# Patient Record
Sex: Male | Born: 1970 | Race: White | Hispanic: No | Marital: Married | Smoking: Current some day smoker
Health system: Southern US, Community
[De-identification: ages and names within clinical notes are randomized; demographics above are authoritative.]

## PROBLEM LIST (undated history)

## (undated) DIAGNOSIS — K219 Gastro-esophageal reflux disease without esophagitis: Secondary | ICD-10-CM

## (undated) HISTORY — DX: Gastro-esophageal reflux disease without esophagitis: K21.9

---

## 2008-05-22 ENCOUNTER — Emergency Department (HOSPITAL_BASED_OUTPATIENT_CLINIC_OR_DEPARTMENT_OTHER): Admission: EM | Admit: 2008-05-22 | Discharge: 2008-05-22 | Payer: Self-pay | Admitting: Emergency Medicine

## 2009-02-07 ENCOUNTER — Ambulatory Visit: Payer: Self-pay | Admitting: Diagnostic Radiology

## 2009-02-07 ENCOUNTER — Emergency Department (HOSPITAL_BASED_OUTPATIENT_CLINIC_OR_DEPARTMENT_OTHER): Admission: EM | Admit: 2009-02-07 | Discharge: 2009-02-07 | Payer: Self-pay | Admitting: Emergency Medicine

## 2011-01-04 LAB — BASIC METABOLIC PANEL
BUN: 17 mg/dL (ref 6–23)
CO2: 27 mEq/L (ref 19–32)
Chloride: 107 mEq/L (ref 96–112)
Glucose, Bld: 123 mg/dL — ABNORMAL HIGH (ref 70–99)
Potassium: 3.8 mEq/L (ref 3.5–5.1)
Sodium: 143 mEq/L (ref 135–145)

## 2011-01-04 LAB — POCT CARDIAC MARKERS
CKMB, poc: <1 ng/mL — ABNORMAL LOW (ref 1.0–8.0)
CKMB, poc: <1 ng/mL — ABNORMAL LOW (ref 1.0–8.0)
Myoglobin, poc: 26.9 ng/mL (ref 12–200)
Myoglobin, poc: 44.9 ng/mL (ref 12–200)
Troponin i, poc: <0.05 ng/mL (ref 0.00–0.09)

## 2011-01-04 LAB — CBC
HCT: 45.6 % (ref 39.0–52.0)
Hemoglobin: 15.1 g/dL (ref 13.0–17.0)
MCHC: 33 g/dL (ref 30.0–36.0)
MCV: 85.9 fL (ref 78.0–100.0)
Platelets: 248 10*3/uL (ref 150–400)
RDW: 12.7 % (ref 11.5–15.5)

## 2011-01-04 LAB — D-DIMER, QUANTITATIVE: D-Dimer, Quant: <0.22 ug/mL-FEU (ref 0.00–0.48)

## 2011-01-04 LAB — DIFFERENTIAL
Basophils Absolute: 0.1 10*3/uL (ref 0.0–0.1)
Basophils Relative: 1 % (ref 0–1)
Eosinophils Absolute: 0.3 10*3/uL (ref 0.0–0.7)
Eosinophils Relative: 2 % (ref 0–5)
Lymphocytes Relative: 16 % (ref 12–46)
Monocytes Absolute: 0.5 10*3/uL (ref 0.1–1.0)

## 2013-02-03 ENCOUNTER — Emergency Department (HOSPITAL_BASED_OUTPATIENT_CLINIC_OR_DEPARTMENT_OTHER)
Admission: EM | Admit: 2013-02-03 | Discharge: 2013-02-03 | Disposition: A | Discharge status: Home / Self Care | Payer: BC Managed Care – PPO | Attending: Emergency Medicine | Admitting: Emergency Medicine

## 2013-02-03 ENCOUNTER — Emergency Department (HOSPITAL_BASED_OUTPATIENT_CLINIC_OR_DEPARTMENT_OTHER): Payer: BC Managed Care – PPO

## 2013-02-03 ENCOUNTER — Encounter (HOSPITAL_BASED_OUTPATIENT_CLINIC_OR_DEPARTMENT_OTHER): Payer: Self-pay | Admitting: *Deleted

## 2013-02-03 DIAGNOSIS — Y9366 Activity, soccer: Secondary | ICD-10-CM | POA: Insufficient documentation

## 2013-02-03 DIAGNOSIS — Y92838 Other recreation area as the place of occurrence of the external cause: Secondary | ICD-10-CM | POA: Insufficient documentation

## 2013-02-03 DIAGNOSIS — Y9239 Other specified sports and athletic area as the place of occurrence of the external cause: Secondary | ICD-10-CM | POA: Insufficient documentation

## 2013-02-03 DIAGNOSIS — S93409A Sprain of unspecified ligament of unspecified ankle, initial encounter: Secondary | ICD-10-CM | POA: Insufficient documentation

## 2013-02-03 DIAGNOSIS — S93402A Sprain of unspecified ligament of left ankle, initial encounter: Secondary | ICD-10-CM

## 2013-02-03 DIAGNOSIS — W219XXA Striking against or struck by unspecified sports equipment, initial encounter: Secondary | ICD-10-CM | POA: Insufficient documentation

## 2013-02-03 MED ORDER — NAPROXEN 250 MG PO TABS
500.0000 mg | ORAL_TABLET | Freq: Once | ORAL | Status: AC
  Administered 2013-02-03: 500 mg via ORAL
  Filled 2013-02-03: qty 2

## 2013-02-03 NOTE — ED Provider Notes (Signed)
History     CSN: [id number]  Arrival date & time Mar 02, 2013  1950   First MD Initiated Contact with Patient 2013/03/02 Mar 02, 2300      Chief Complaint  Patient presents with  . Ankle Injury    (Consider location/radiation/quality/duration/timing/severity/associated sxs/prior treatment) HPI Is a 42 year old male who was playing soccer about 7:30. He suffered an inversion injury of the left ankle. He now has mild to moderate pain in the left lateral ankle. There is associated ecchymosis and swelling. He denies other injury. He denies motor or sensory deficit distally. He denies other injury. Pain is worse with palpation, movement or attempted ambulation.  History reviewed. No pertinent past medical history.  History reviewed. No pertinent past surgical history.  History reviewed. No pertinent family history.  History  Substance Use Topics  . Smoking status: Never Smoker   . Smokeless tobacco: Not on file  . Alcohol Use: No      Review of Systems  All other systems reviewed and are negative.    Allergies  Review of patient's allergies indicates no known allergies.  Home Medications  No current outpatient prescriptions on file.  BP 109/82  Pulse 98  Temp(Src) 99.7 F (37.6 C) (Oral)  Resp 16  Ht 5\' 9"  (1.753 m)  Wt 160 lb (72.576 kg)  BMI 23.62 kg/m2  SpO2 95%  Physical Exam General: Well-developed, well-nourished male in no acute distress; appearance consistent with age of record HENT: normocephalic, atraumatic Eyes: Normal appearance Neck: supple Heart: regular rate and rhythm Lungs: Normal respiratory effort and excursion Abdomen: soft; nondistended Extremities: Swelling, ecchymosis and tenderness over left lateral malleolus and left distal lower leg; left foot pulses normal with intact sensation and tendon function and brisk capillary refill distally Neurologic: Awake, alert and oriented; motor function intact in all extremities and symmetric; no facial  droop Skin: Warm and dry Psychiatric: Normal mood and affect    ED Course  Procedures (including critical care time)    MDM  Nursing notes and vitals signs, including pulse oximetry, reviewed.  Summary of this visit's results, reviewed by myself:   Imaging Studies: Dg Ankle Complete Left  03/02/2013  *RADIOLOGY REPORT*  Clinical Data: Twisted ankle.  Left ankle pain.  LEFT ANKLE COMPLETE - 3+ VIEW  Comparison: None.  Findings: No fracture.  Ankle mortise congruent.  Marked lateral malleolar soft tissue swelling is present.  Anatomic alignment.  IMPRESSION: No acute osseous abnormality.  Lateral soft tissue swelling.   Original Report Authenticated By: Andreas Newport, M.D.             Hanley Seamen, MD Mar 02, 2013 03/03/07

## 2013-02-03 NOTE — ED Notes (Signed)
Pt injured his left ankle playing soccer this p.m. Ice applied at triage.

## 2013-02-14 ENCOUNTER — Ambulatory Visit (INDEPENDENT_AMBULATORY_CARE_PROVIDER_SITE_OTHER): Payer: BC Managed Care – PPO | Admitting: Family Medicine

## 2013-02-14 ENCOUNTER — Encounter: Payer: Self-pay | Admitting: Family Medicine

## 2013-02-14 VITALS — BP 114/75 | HR 89 | Ht 68.0 in | Wt 160.0 lb

## 2013-02-14 DIAGNOSIS — S99922A Unspecified injury of left foot, initial encounter: Secondary | ICD-10-CM | POA: Insufficient documentation

## 2013-02-14 DIAGNOSIS — S99912A Unspecified injury of left ankle, initial encounter: Secondary | ICD-10-CM | POA: Insufficient documentation

## 2013-02-14 DIAGNOSIS — S8990XA Unspecified injury of unspecified lower leg, initial encounter: Secondary | ICD-10-CM

## 2013-02-14 NOTE — Patient Instructions (Addendum)
We will go ahead with an MRI of your left ankle/foot. It's very unusual that you still can't put weight on this and that you have tenderness over the navicular bone of the foot. This bone is notorious for not showing a fracture on x-rays. Continue using the ankle brace and crutches for now. Ice 15 minutes at a time 3-4 times a day. Elevate as much as possible. Ibuprofen 600mg  three times a day with food for pain and inflammation. I will call you the business day following your MRI to go over results and next steps.

## 2013-02-14 NOTE — Progress Notes (Addendum)
Subjective:    Patient ID: Martin Chavez, male    DOB: 1971-05-16, 42 y.o.   MRN: [id number]  PCP: None  HPI 42 yo M here for left ankle injury.  Patient reports on 5/11 he was playing soccer in the evening when he inverted his left ankle in an uneven part of the ground. Unable to bear weight following this and still unable to do so. Heard a loud pop. Was very swollen but now has improved. Has been icing. Not taking anything for pain. Using ankle brace and crutches. No prior injuries to left foot/ankle. Radiographs of left ankle negative.  History reviewed. No pertinent past medical history.  No current outpatient prescriptions on file prior to visit.   No current facility-administered medications on file prior to visit.    History reviewed. No pertinent past surgical history.  No Known Allergies  History   Social History  . Marital Status: Single    Spouse Name: N/A    Number of Children: N/A  . Years of Education: N/A   Occupational History  . Not on file.   Social History Main Topics  . Smoking status: Never Smoker   . Smokeless tobacco: Not on file  . Alcohol Use: No  . Drug Use: No  . Sexually Active: Not on file   Other Topics Concern  . Not on file   Social History Narrative  . No narrative on file    Family History  Problem Relation Age of Onset  . Sudden death Neg Hx   . Hypertension Neg Hx   . Hyperlipidemia Neg Hx   . Heart attack Neg Hx   . Diabetes Neg Hx     BP 114/75  Pulse 89  Ht 5\' 8"  (1.727 m)  Wt 160 lb (72.576 kg)  BMI 24.33 kg/m2  Review of Systems See HPI above.    Objective:   Physical Exam Gen: NAD  L ankle/foot: Mild-mod swelling medial to lateral ankle. Mod limitation of ROM all directions.  Able to resist all motions but pain with resisted IR at navicular area. TTP greatest at navicular, ATFL.  No TTP medial, lateral malleoli, base 5th, elsewhere foot/ankle, fibular head. Trace ant drawer.  + talar  tilt.   Negative syndesmotic compression. Thompsons test negative. NV intact distally.  MSK u/s:  Peroneal tendons and post tib tendon intact left ankle.  Increased fluid surrounding navicular and post tib tendon however.  No talar dome irregularities.    Assessment & Plan:  1. Left ankle/foot injury - initial radiographs negative of ankle.  Visualization of navicular does not show a fracture either.  Ultrasound not very sensitive for identifying fractures of this bone though post tib and peroneals appear intact.  Given he is 10 days out, with focal navicular tenderness, can still not bear weight and has very limited motion of ankle, advised we go ahead with MRI to assess for occult navicular or other fracture of ankle/foot.  Addendum 5/28:  MRI reviewed and discussed with patient.  No evidence of occult fracture.  Does have multiple contusions, not surprisingly has an ATFL tear.  Tendinopathy of post tib tendon.  ? Of longitudinal split type tear of peroneals - not seen on ultrasound and not very tender post to lateral malleolus - may be an artifact.  If present this is not a complete tear and he should do well with conservative care.  Start formal PT, icing, nsaids, compression.  Advised to f/u with me in 4 weeks  following start of PT for reevaluation.  Consider nitro patches in future.

## 2013-02-14 NOTE — Assessment & Plan Note (Signed)
initial radiographs negative of ankle.  Visualization of navicular does not show a fracture either.  Ultrasound not very sensitive for identifying fractures of this bone though post tib and peroneals appear intact.  Given he is 10 days out, with focal navicular tenderness, can still not bear weight and has very limited motion of ankle, advised we go ahead with MRI to assess for occult navicular or other fracture of ankle/foot.

## 2013-02-16 ENCOUNTER — Ambulatory Visit (HOSPITAL_BASED_OUTPATIENT_CLINIC_OR_DEPARTMENT_OTHER)
Admission: RE | Admit: 2013-02-16 | Discharge: 2013-02-16 | Disposition: A | Discharge status: Home / Self Care | Payer: BC Managed Care – PPO | Source: Ambulatory Visit | Attending: Family Medicine | Admitting: Family Medicine

## 2013-02-16 DIAGNOSIS — M25579 Pain in unspecified ankle and joints of unspecified foot: Secondary | ICD-10-CM | POA: Insufficient documentation

## 2013-02-16 DIAGNOSIS — S99912A Unspecified injury of left ankle, initial encounter: Secondary | ICD-10-CM

## 2013-02-16 DIAGNOSIS — X58XXXA Exposure to other specified factors, initial encounter: Secondary | ICD-10-CM | POA: Insufficient documentation

## 2013-02-16 DIAGNOSIS — M659 Synovitis and tenosynovitis, unspecified: Secondary | ICD-10-CM | POA: Insufficient documentation

## 2013-02-16 DIAGNOSIS — Y9366 Activity, soccer: Secondary | ICD-10-CM | POA: Insufficient documentation

## 2013-02-16 DIAGNOSIS — M65979 Unspecified synovitis and tenosynovitis, unspecified ankle and foot: Secondary | ICD-10-CM | POA: Insufficient documentation

## 2013-02-16 DIAGNOSIS — S9000XA Contusion of unspecified ankle, initial encounter: Secondary | ICD-10-CM | POA: Insufficient documentation

## 2013-02-16 DIAGNOSIS — M25473 Effusion, unspecified ankle: Secondary | ICD-10-CM | POA: Insufficient documentation

## 2013-02-16 DIAGNOSIS — M25476 Effusion, unspecified foot: Secondary | ICD-10-CM | POA: Insufficient documentation

## 2013-02-28 ENCOUNTER — Ambulatory Visit: Payer: BC Managed Care – PPO | Attending: Family Medicine | Admitting: Rehabilitation

## 2013-02-28 DIAGNOSIS — M25673 Stiffness of unspecified ankle, not elsewhere classified: Secondary | ICD-10-CM | POA: Insufficient documentation

## 2013-02-28 DIAGNOSIS — IMO0001 Reserved for inherently not codable concepts without codable children: Secondary | ICD-10-CM | POA: Insufficient documentation

## 2013-02-28 DIAGNOSIS — M25579 Pain in unspecified ankle and joints of unspecified foot: Secondary | ICD-10-CM | POA: Insufficient documentation

## 2013-02-28 DIAGNOSIS — R609 Edema, unspecified: Secondary | ICD-10-CM | POA: Insufficient documentation

## 2013-02-28 DIAGNOSIS — R262 Difficulty in walking, not elsewhere classified: Secondary | ICD-10-CM | POA: Insufficient documentation

## 2013-02-28 DIAGNOSIS — M25676 Stiffness of unspecified foot, not elsewhere classified: Secondary | ICD-10-CM | POA: Insufficient documentation

## 2013-03-04 ENCOUNTER — Ambulatory Visit: Payer: BC Managed Care – PPO | Admitting: Rehabilitation

## 2013-03-06 ENCOUNTER — Ambulatory Visit: Payer: BC Managed Care – PPO | Admitting: Rehabilitation

## 2013-07-15 ENCOUNTER — Emergency Department (HOSPITAL_BASED_OUTPATIENT_CLINIC_OR_DEPARTMENT_OTHER)
Admission: EM | Admit: 2013-07-15 | Discharge: 2013-07-15 | Disposition: A | Discharge status: Home / Self Care | Payer: BC Managed Care – PPO | Attending: Emergency Medicine | Admitting: Emergency Medicine

## 2013-07-15 DIAGNOSIS — M545 Low back pain, unspecified: Secondary | ICD-10-CM | POA: Insufficient documentation

## 2013-07-15 MED ORDER — DIAZEPAM 5 MG PO TABS: 5.0000 mg | ORAL_TABLET | Freq: Three times a day (TID) | ORAL | Status: DC | PRN

## 2013-07-15 MED ORDER — ONDANSETRON HCL 4 MG/2ML IJ SOLN
4.0000 mg | Freq: Once | INTRAMUSCULAR | Status: AC
  Administered 2013-07-15: 4 mg via INTRAVENOUS

## 2013-07-15 MED ORDER — FENTANYL CITRATE 0.05 MG/ML IJ SOLN
100.0000 ug | Freq: Once | INTRAMUSCULAR | Status: AC
  Administered 2013-07-15: 100 ug via INTRAVENOUS
  Filled 2013-07-15: qty 2

## 2013-07-15 MED ORDER — HYDROMORPHONE HCL 2 MG PO TABS: 2.0000 mg | ORAL_TABLET | ORAL | Status: DC | PRN

## 2013-07-15 MED ORDER — DIAZEPAM 5 MG/ML IJ SOLN
5.0000 mg | Freq: Once | INTRAMUSCULAR | Status: AC
  Administered 2013-07-15: 5 mg via INTRAVENOUS
  Filled 2013-07-15: qty 2

## 2013-07-15 MED ORDER — HYDROMORPHONE HCL PF 1 MG/ML IJ SOLN
1.0000 mg | Freq: Once | INTRAMUSCULAR | Status: AC
  Administered 2013-07-15: 1 mg via INTRAVENOUS

## 2013-07-15 MED ORDER — HYDROMORPHONE HCL PF 1 MG/ML IJ SOLN
INTRAMUSCULAR | Status: AC
  Administered 2013-07-15: 1 mg via INTRAVENOUS
  Filled 2013-07-15: qty 1

## 2013-07-15 MED ORDER — ONDANSETRON HCL 4 MG/2ML IJ SOLN
INTRAMUSCULAR | Status: AC
  Administered 2013-07-15: 4 mg via INTRAVENOUS
  Filled 2013-07-15: qty 2

## 2013-07-15 NOTE — ED Notes (Signed)
Pt reports that he qwas kicking a ball one day ago and began having slight back pain that progressed to unbearable pain today

## 2013-07-15 NOTE — ED Provider Notes (Signed)
CSN: [id number]     Arrival date & time 07/15/13  0510 History   First MD Initiated Contact with Patient 07/15/13 [phone]     Chief Complaint  Patient presents with  . Back Pain   (Consider location/radiation/quality/duration/timing/severity/associated sxs/prior Treatment) HPI This is a 42 year old male with a remote history of back pain. He was kicking a ball yesterday evening for recreation. He developed some mild lower back pain but worsened overnight and became severe this morning. The pain is in the lumbar region radiating to both buttocks, left greater than right. It is worse with movement. He describes it as feeling like spasms. He denies associated lower sternum and leg numbness or weakness, saddle anesthesia or bowel or bladder changes. He took naproxen at home without relief. He was given 50 mcg of fentanyl IV by EMS prior to arrival with partial relief of his pain. He placed a lumbar brace on himself earlier.   No past medical history on file. No past surgical history on file. Family History  Problem Relation Age of Onset  . Sudden death Neg Hx   . Hypertension Neg Hx   . Hyperlipidemia Neg Hx   . Heart attack Neg Hx   . Diabetes Neg Hx    History  Substance Use Topics  . Smoking status: Never Smoker   . Smokeless tobacco: Not on file  . Alcohol Use: No    Review of Systems  All other systems reviewed and are negative.    Allergies  Review of patient's allergies indicates no known allergies.  Home Medications  No current outpatient prescriptions on file. BP 123/88  Pulse 62  Temp(Src) 97.8 F (36.6 C) (Oral)  Ht 5\' 8"  (1.727 m)  Wt 160 lb (72.576 kg)  BMI 24.33 kg/m2  SpO2 100%  Physical Exam General: Well-developed, well-nourished male in no acute distress; appearance consistent with age of record; appears uncomfortable HENT: normocephalic; atraumatic Eyes: pupils equal, round and reactive to light; extraocular muscles intact Neck: supple Heart: regular  rate and rhythm Lungs: clear to auscultation bilaterally Abdomen: soft; nondistended; nontender; no masses or hepatosplenomegaly; bowel sounds present Extremities: No deformity; full range of motion; pulses normal Back: Bilateral paralumbar tenderness; pain on straight leg raise at 15 on the left, 30 on the right; pain on attempted movement of the lower back Neurologic: Awake, alert and oriented; motor function intact in all extremities and symmetric; sensation intact and symmetrical; no facial droop Skin: Warm and dry Psychiatric: Anxious    ED Course  Procedures (including critical care time)  MDM  6:20 AM Patient resting comfortably at this time after an additional 150 mcg of fentanyl, 5 mg Valium 1 mg of Dilaudid. Oxygen saturation on room air is in the high 90s.  6:43 AM Patient able to stand and ambulate without severe pain. We will treat him with analgesics and muscle accident. He was advised that most back pain resolves in a few days but in the meantime he should limit activity as tolerated. He states he has a primary care physician with whom he can followup should symptoms persist.    Hanley Seamen, MD 07/15/13 [phone]

## 2013-12-01 IMAGING — CR DG ANKLE COMPLETE 3+V*L*
3 series · 3 of 3 positions shown · non-contrast
Comparison: None.

CLINICAL DATA: Twisted ankle.  Left ankle pain.

LEFT ANKLE COMPLETE - 3+ VIEW

[t ankle joint ap left]
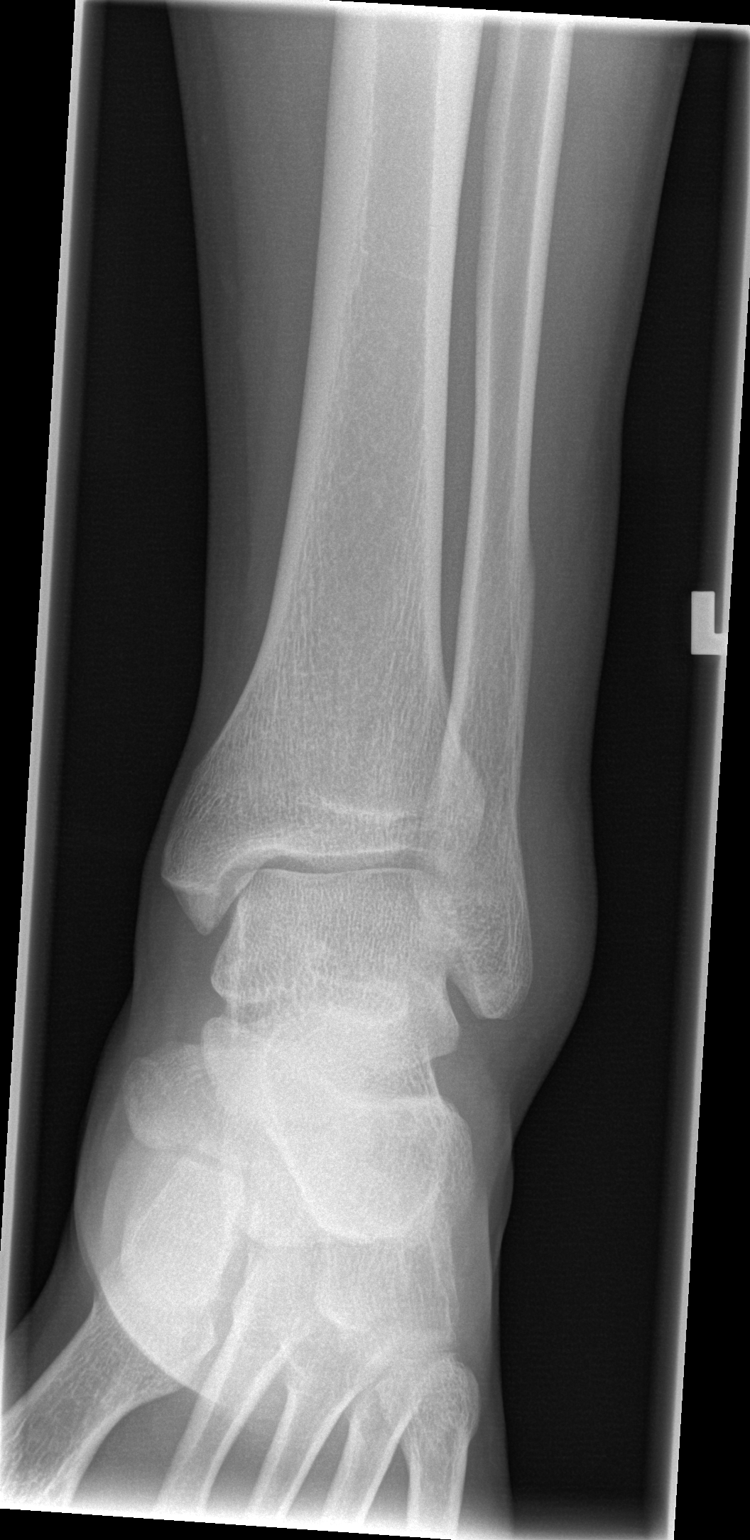

[t ankle joint oblique left]
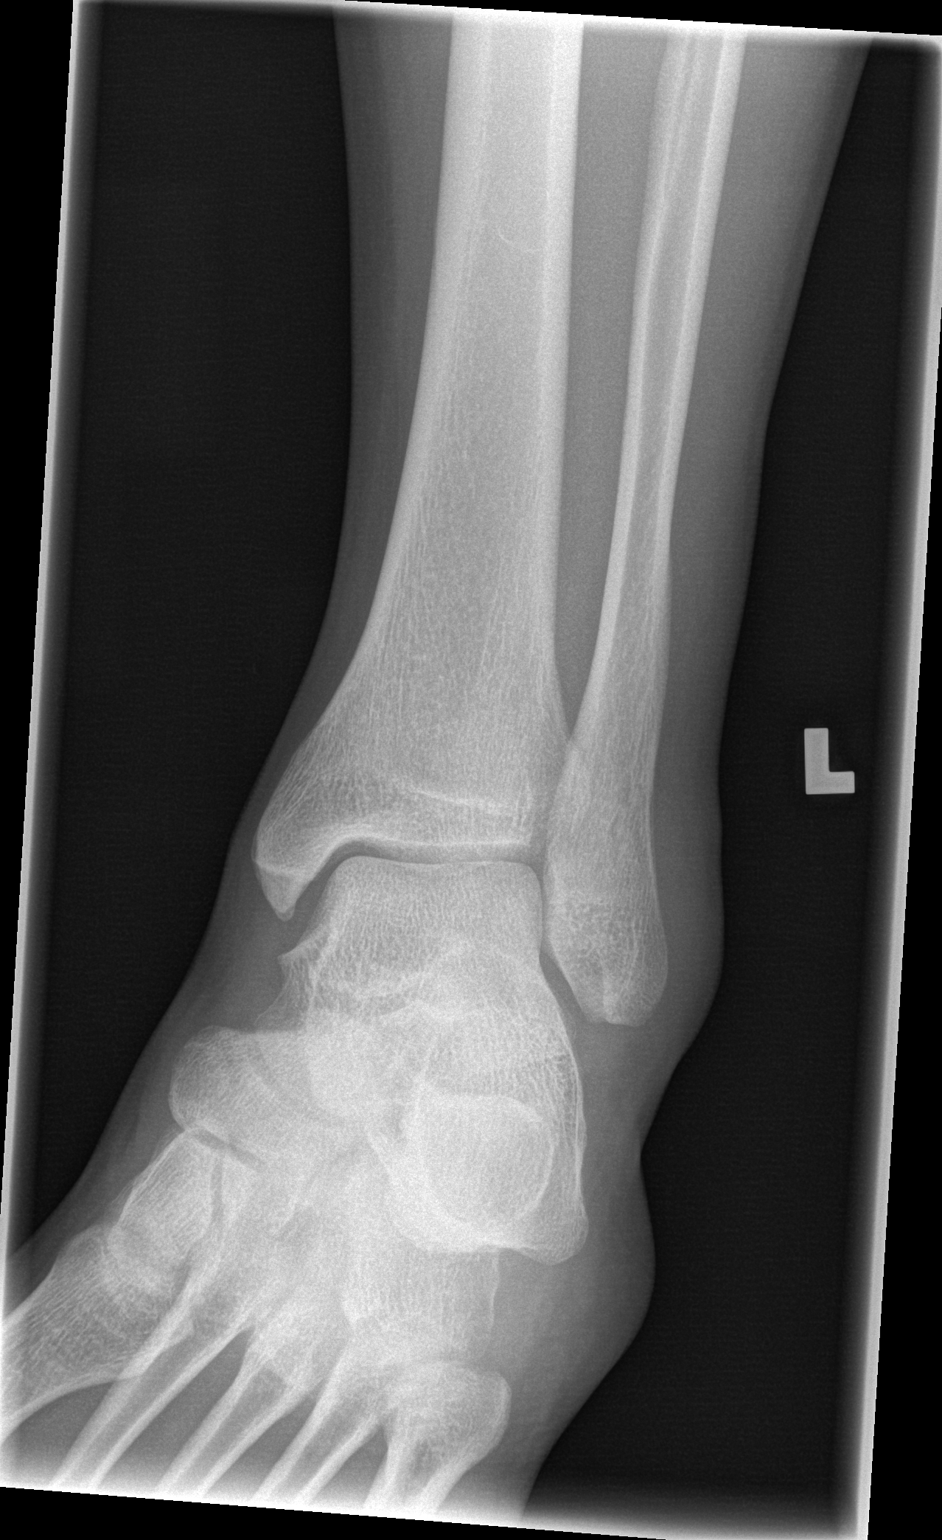

[t ankle joint lat left]
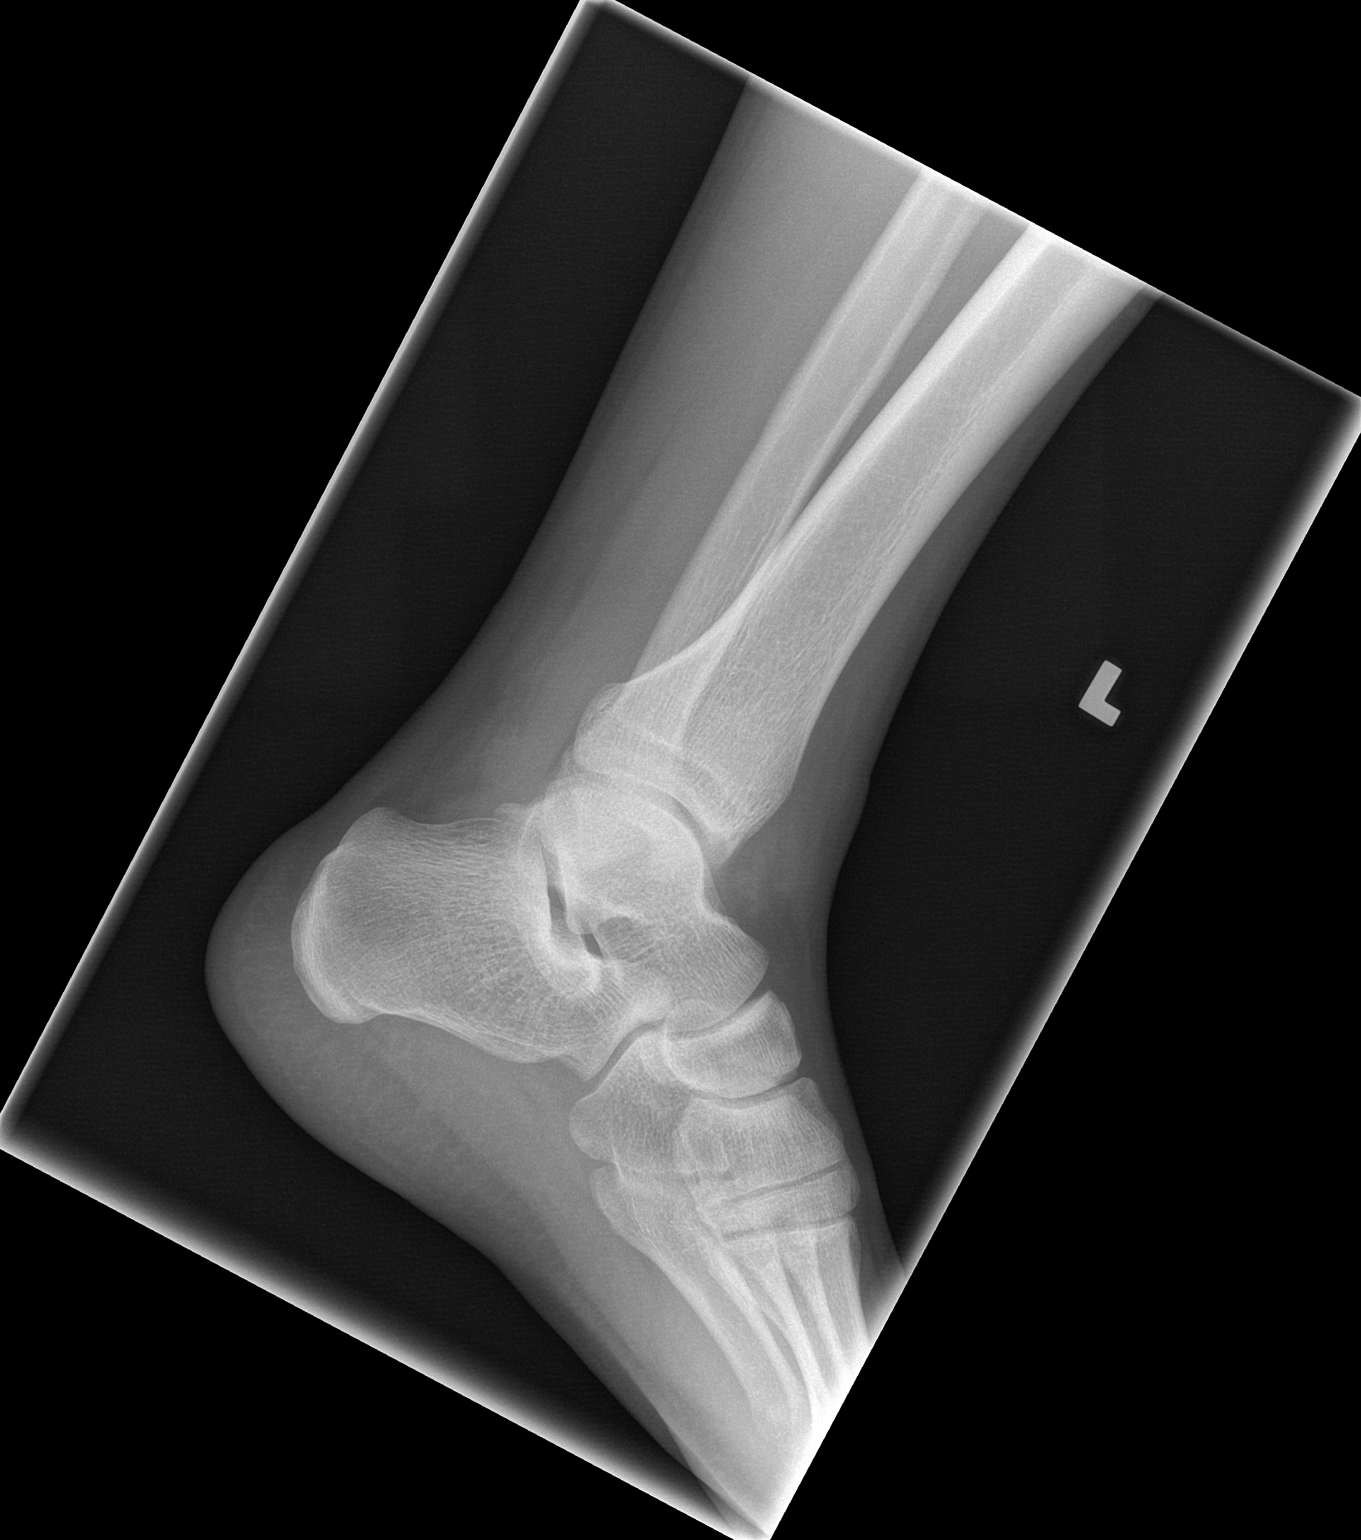

[3 of 3 positions shown; findings below may reference images not displayed]

FINDINGS: No fracture.  Ankle mortise congruent.  Marked lateral
malleolar soft tissue swelling is present.  Anatomic alignment.
IMPRESSION: No acute osseous abnormality.  Lateral soft tissue swelling.

## 2018-09-21 ENCOUNTER — Other Ambulatory Visit: Payer: Self-pay | Admitting: Gastroenterology

## 2018-09-21 DIAGNOSIS — K760 Fatty (change of) liver, not elsewhere classified: Secondary | ICD-10-CM

## 2021-09-21 DIAGNOSIS — D485 Neoplasm of uncertain behavior of skin: Secondary | ICD-10-CM | POA: Diagnosis not present

## 2021-09-21 DIAGNOSIS — D224 Melanocytic nevi of scalp and neck: Secondary | ICD-10-CM | POA: Diagnosis not present

## 2022-01-13 ENCOUNTER — Encounter: Payer: Self-pay | Admitting: Medical

## 2022-01-13 ENCOUNTER — Ambulatory Visit: Payer: BC Managed Care – PPO | Admitting: Medical

## 2022-01-13 VITALS — BP 118/80 | HR 80 | Temp 98.5°F | Resp 18 | Ht 68.0 in | Wt 158.6 lb

## 2022-01-13 DIAGNOSIS — Z1211 Encounter for screening for malignant neoplasm of colon: Secondary | ICD-10-CM | POA: Diagnosis not present

## 2022-01-13 DIAGNOSIS — Z0000 Encounter for general adult medical examination without abnormal findings: Secondary | ICD-10-CM | POA: Diagnosis not present

## 2022-01-13 DIAGNOSIS — Z125 Encounter for screening for malignant neoplasm of prostate: Secondary | ICD-10-CM

## 2022-01-13 DIAGNOSIS — Z1283 Encounter for screening for malignant neoplasm of skin: Secondary | ICD-10-CM

## 2022-01-13 DIAGNOSIS — L989 Disorder of the skin and subcutaneous tissue, unspecified: Secondary | ICD-10-CM

## 2022-01-13 LAB — COMPREHENSIVE METABOLIC PANEL
ALT: 22 U/L (ref 0–53)
AST: 16 U/L (ref 0–37)
Albumin: 4.5 g/dL (ref 3.5–5.2)
Alkaline Phosphatase: 74 U/L (ref 39–117)
BUN: 15 mg/dL (ref 6–23)
CO2: 28 mEq/L (ref 19–32)
Calcium: 9.3 mg/dL (ref 8.4–10.5)
Chloride: 106 mEq/L (ref 96–112)
Creatinine, Ser: 0.93 mg/dL (ref 0.40–1.50)
GFR: 94.55 mL/min (ref >60.00)
Glucose, Bld: 90 mg/dL (ref 70–99)
Potassium: 3.9 mEq/L (ref 3.5–5.1)
Sodium: 140 mEq/L (ref 135–145)
Total Bilirubin: 0.5 mg/dL (ref 0.2–1.2)
Total Protein: 6.8 g/dL (ref 6.0–8.3)

## 2022-01-13 LAB — CBC WITH DIFFERENTIAL/PLATELET
Basophils Absolute: 0 10*3/uL (ref 0.0–0.1)
Basophils Relative: 0.6 % (ref 0.0–3.0)
Eosinophils Absolute: 0.2 10*3/uL (ref 0.0–0.7)
Eosinophils Relative: 2.3 % (ref 0.0–5.0)
HCT: 44 % (ref 39.0–52.0)
Hemoglobin: 14.8 g/dL (ref 13.0–17.0)
Lymphocytes Relative: 31.6 % (ref 12.0–46.0)
Lymphs Abs: 2.2 10*3/uL (ref 0.7–4.0)
MCHC: 33.7 g/dL (ref 30.0–36.0)
MCV: 86.1 fl (ref 78.0–100.0)
Monocytes Absolute: 0.5 10*3/uL (ref 0.1–1.0)
Monocytes Relative: 7.8 % (ref 3.0–12.0)
Neutro Abs: 4 10*3/uL (ref 1.4–7.7)
Neutrophils Relative %: 57.7 % (ref 43.0–77.0)
Platelets: 241 10*3/uL (ref 150.0–400.0)
RBC: 5.11 Mil/uL (ref 4.22–5.81)
RDW: 14.6 % (ref 11.5–15.5)
WBC: 7 10*3/uL (ref 4.0–10.5)

## 2022-01-13 LAB — LIPID PANEL
Cholesterol: 155 mg/dL (ref 0–200)
HDL: 40.2 mg/dL (ref >39.00)
LDL Cholesterol: 97 mg/dL (ref 0–99)
NonHDL: 114.62
Total CHOL/HDL Ratio: 4
Triglycerides: 88 mg/dL (ref 0.0–149.0)
VLDL: 17.6 mg/dL (ref 0.0–40.0)

## 2022-01-13 LAB — PSA: PSA: 1.33 ng/mL (ref 0.10–4.00)

## 2022-01-13 NOTE — Progress Notes (Signed)
? ?Subjective:  ? ? Patient ID: Martin Chavez, male    DOB: [birth year], 51 y.o.   MRN: [id number] ? ?HPI ? ?Pt in for first time. ? ?Pt works for [employer]. Pt has been exercising/going to gym 2-3 times a week. Pt states eating healthy. Occasional smoker formerly heavier smoker. ? ?Decided to go ahead and do wellness exam today. Pt is fasting. ? ?Pt never had colonoscopy.  ? ?Pt not sure if had tetanus vaccine in past 10 years.  ? ?Pt wants to wait and reconsider the shingrix vaccine. ? ?Pt had one covid vaccine. [employer].  ? ? ? ?Pt feels fine/no complaints. He said one day last week his diastolic bp was over 90 for over a week when he was checking.(He states he had 94-96) over past month he states getting normal readings such as 80 diastolic. ? ? ?Occasional reflux- if occurs will get at night.  ? ?Pt declnes referral for screening ct chest for hx of smokeing. ? ?Review of Systems  ?Constitutional:  Negative for chills, fatigue and fever.  ?HENT:  Negative for congestion, ear pain and facial swelling.   ?Respiratory:  Negative for cough, chest tightness, shortness of breath and wheezing.   ?Cardiovascular:  Negative for chest pain and palpitations.  ?Gastrointestinal:  Negative for abdominal pain, blood in stool, constipation, nausea and vomiting.  ?Genitourinary:  Negative for dysuria, flank pain and frequency.  ?Musculoskeletal:  Negative for back pain and myalgias.  ?Skin:  Negative for rash.  ?Neurological:  Negative for dizziness, seizures and headaches.  ?Hematological:  Negative for adenopathy. Does not bruise/bleed easily.  ?Psychiatric/Behavioral:  Negative for behavioral problems and decreased concentration.   ? ? ?History reviewed. No pertinent past medical history. ?  ?Social History  ? ?Socioeconomic History  ? Marital status: Single  ?  Spouse name: Not on file  ? Number of children: Not on file  ? Years of education: Not on file  ? Highest education level: Not on file   ?Occupational History  ? Not on file  ?Tobacco Use  ? Smoking status: Every Day  ?  Packs/day: 1.00  ?  Years: 30.00  ?  Pack years: 30.00  ?  Types: Cigarettes  ? Smokeless tobacco: Never  ?Vaping Use  ? Vaping Use: Never used  ?Substance and Sexual Activity  ? Alcohol use: No  ? Drug use: No  ? Sexual activity: Yes  ?Other Topics Concern  ? Not on file  ?Social History Narrative  ? Not on file  ? ?Social Determinants of Health  ? ?Financial Resource Strain: Not on file  ?Food Insecurity: Not on file  ?Transportation Needs: Not on file  ?Physical Activity: Not on file  ?Stress: Not on file  ?Social Connections: Not on file  ?Intimate Partner Violence: Not on file  ? ? ?History reviewed. No pertinent surgical history. ? ?Family History  ?Problem Relation Age of Onset  ? Sudden death Neg Hx   ? Hypertension Neg Hx   ? Hyperlipidemia Neg Hx   ? Heart attack Neg Hx   ? Diabetes Neg Hx   ? ? ?No Known Allergies ? ?No current outpatient medications on file prior to visit.  ? ?No current facility-administered medications on file prior to visit.  ? ? ?BP 124/80   Pulse 80   Temp 98.5 ?F (36.9 ?C)   Resp 18   Ht 5\' 8"  (1.727 m)   Wt 158 lb 9.6 oz (71.9 kg)  SpO2 98%   BMI 24.12 kg/m?  ?  ?   ?Objective:  ? Physical Exam ? ?General ?Mental Status- Alert. General Appearance- Not in acute distress.  ? ?Skin ?General: Color- Normal Color. Moisture- Normal Moisture. ? ?Neck ?Carotid Arteries- Normal color. Moisture- Normal Moisture. No carotid bruits. No JVD. ? ?Chest and Lung Exam ?Auscultation: ?Breath Sounds:-Normal. ? ?Cardiovascular ?Auscultation:Rythm- Regular. ?Murmurs & Other Heart Sounds:Auscultation of the heart reveals- No Murmurs. ? ?Abdomen ?Inspection:-Inspeection Normal. ?Palpation/Percussion:Note:No mass. Palpation and Percussion of the abdomen reveal- Non Tender, Non Distended + BS, no rebound or guarding. ? ? ?Neurologic ?Cranial Nerve exam:- CN III-XII intact(No nystagmus), symmetric  smile. ?Strength:- 5/5 equal and symmetric strength both upper and lower extremities.  ? ? ?Derm- scattered small moles on back. Also skin tags on back. Rt shoulder moderate large mole with color variation/darker in center. ? ? ?   ?Assessment & Plan:  ? ?For you wellness exam today I have ordered cbc, cmp and  lipid panel. ? ?Offered shingrix vaccine today. You decided will think about and possibly get later. If you would also investigate when your last tetanus vaccine was. If more than 10 years then you are do.  ? ?Recommend exercise and healthy diet. ? ?We will let you know lab results as they come in. ? ?Follow up date appointment will be determined after lab review.   ? ?Refer for screening colonoscopy.  ? ?For occasional gerd recommend famotadine/pepcid. ?

## 2022-01-13 NOTE — Patient Instructions (Addendum)
For you wellness exam today I have ordered cbc, cmp and  lipid panel. ? ?Offered shingrix vaccine today. You decided will think about and possibly get later. If you would also investigate when your last tetanus vaccine was. If more than 10 years then you are do.  ? ?Recommend exercise and healthy diet. ? ?We will let you know lab results as they come in. ? ?Follow up date appointment will be determined after lab review.   ? ?Refer for screening colonoscopy.  ? ?For occasional gerd recommend famotadine/pepcid. ? ?Preventive Care 19-107 Years Old, Male ?Preventive care refers to lifestyle choices and visits with your health care provider that can promote health and wellness. Preventive care visits are also called wellness exams. ?What can I expect for my preventive care visit? ?Counseling ?During your preventive care visit, your health care provider may ask about your: ?Medical history, including: ?Past medical problems. ?Family medical history. ?Current health, including: ?Emotional well-being. ?Home life and relationship well-being. ?Sexual activity. ?Lifestyle, including: ?Alcohol, nicotine or tobacco, and drug use. ?Access to firearms. ?Diet, exercise, and sleep habits. ?Safety issues such as seatbelt and bike helmet use. ?Sunscreen use. ?Work and work Statistician. ?Physical exam ?Your health care provider will check your: ?Height and weight. These may be used to calculate your BMI (body mass index). BMI is a measurement that tells if you are at a healthy weight. ?Waist circumference. This measures the distance around your waistline. This measurement also tells if you are at a healthy weight and may help predict your risk of certain diseases, such as type 2 diabetes and high blood pressure. ?Heart rate and blood pressure. ?Body temperature. ?Skin for abnormal spots. ?What immunizations do I need? ? ?Vaccines are usually given at various ages, according to a schedule. Your health care provider will recommend  vaccines for you based on your age, medical history, and lifestyle or other factors, such as travel or where you work. ?What tests do I need? ?Screening ?Your health care provider may recommend screening tests for certain conditions. This may include: ?Lipid and cholesterol levels. ?Diabetes screening. This is done by checking your blood sugar (glucose) after you have not eaten for a while (fasting). ?Hepatitis B test. ?Hepatitis C test. ?HIV (human immunodeficiency virus) test. ?STI (sexually transmitted infection) testing, if you are at risk. ?Lung cancer screening. ?Prostate cancer screening. ?Colorectal cancer screening. ?Talk with your health care provider about your test results, treatment options, and if necessary, the need for more tests. ?Follow these instructions at home: ?Eating and drinking ? ?Eat a diet that includes fresh fruits and vegetables, whole grains, lean protein, and low-fat dairy products. ?Take vitamin and mineral supplements as recommended by your health care provider. ?Do not drink alcohol if your health care provider tells you not to drink. ?If you drink alcohol: ?Limit how much you have to 0-2 drinks a day. ?Know how much alcohol is in your drink. In the [place] one drink equals one 12 oz bottle of beer (355 mL), one 5 oz glass of wine (148 mL), or one 1? oz glass of hard liquor (44 mL). ?Lifestyle ?Brush your teeth every morning and night with fluoride toothpaste. Floss one time each day. ?Exercise for at least 30 minutes 5 or more days each week. ?Do not use any products that contain nicotine or tobacco. These products include cigarettes, chewing tobacco, and vaping devices, such as e-cigarettes. If you need help quitting, ask your health care provider. ?Do not use drugs. ?If you are sexually  active, practice safe sex. Use a condom or other form of protection to prevent STIs. ?Take aspirin only as told by your health care provider. Make sure that you understand how much to take and what  form to take. Work with your health care provider to find out whether it is safe and beneficial for you to take aspirin daily. ?Find healthy ways to manage stress, such as: ?Meditation, yoga, or listening to music. ?Journaling. ?Talking to a trusted person. ?Spending time with friends and family. ?Minimize exposure to UV radiation to reduce your risk of skin cancer. ?Safety ?Always wear your seat belt while driving or riding in a vehicle. ?Do not drive: ?If you have been drinking alcohol. Do not ride with someone who has been drinking. ?When you are tired or distracted. ?While texting. ?If you have been using any mind-altering substances or drugs. ?Wear a helmet and other protective equipment during sports activities. ?If you have firearms in your house, make sure you follow all gun safety procedures. ?What's next? ?Go to your health care provider once a year for an annual wellness visit. ?Ask your health care provider how often you should have your eyes and teeth checked. ?Stay up to date on all vaccines. ?This information is not intended to replace advice given to you by your health care provider. Make sure you discuss any questions you have with your health care provider. ?Document Revised: 03/10/2021 Document Reviewed: 03/10/2021 ?Elsevier Patient Education ? [place]. ? ? ? ?

## 2023-04-26 ENCOUNTER — Other Ambulatory Visit: Payer: Self-pay

## 2023-04-26 ENCOUNTER — Emergency Department (HOSPITAL_BASED_OUTPATIENT_CLINIC_OR_DEPARTMENT_OTHER): Payer: BC Managed Care – PPO

## 2023-04-26 ENCOUNTER — Emergency Department (HOSPITAL_BASED_OUTPATIENT_CLINIC_OR_DEPARTMENT_OTHER)
Admission: EM | Admit: 2023-04-26 | Discharge: 2023-04-26 | Disposition: A | Discharge status: Home / Self Care | Payer: BC Managed Care – PPO | Attending: Emergency Medicine | Admitting: Emergency Medicine

## 2023-04-26 ENCOUNTER — Encounter (HOSPITAL_BASED_OUTPATIENT_CLINIC_OR_DEPARTMENT_OTHER): Payer: Self-pay | Admitting: Emergency Medicine

## 2023-04-26 DIAGNOSIS — R0602 Shortness of breath: Secondary | ICD-10-CM | POA: Diagnosis not present

## 2023-04-26 DIAGNOSIS — F17200 Nicotine dependence, unspecified, uncomplicated: Secondary | ICD-10-CM | POA: Insufficient documentation

## 2023-04-26 DIAGNOSIS — I4891 Unspecified atrial fibrillation: Secondary | ICD-10-CM | POA: Insufficient documentation

## 2023-04-26 DIAGNOSIS — R000 Tachycardia, unspecified: Secondary | ICD-10-CM | POA: Diagnosis not present

## 2023-04-26 DIAGNOSIS — R002 Palpitations: Secondary | ICD-10-CM | POA: Diagnosis not present

## 2023-04-26 LAB — BASIC METABOLIC PANEL
Anion gap: 10 (ref 5–15)
BUN: 14 mg/dL (ref 6–20)
CO2: 21 mmol/L — ABNORMAL LOW (ref 22–32)
Calcium: 9.1 mg/dL (ref 8.9–10.3)
Chloride: 107 mmol/L (ref 98–111)
Creatinine, Ser: 1 mg/dL (ref 0.61–1.24)
GFR, Estimated: >60 mL/min (ref >60)
Glucose, Bld: 113 mg/dL — ABNORMAL HIGH (ref 70–99)
Potassium: 3.6 mmol/L (ref 3.5–5.1)
Sodium: 138 mmol/L (ref 135–145)

## 2023-04-26 LAB — CBC
HCT: 46.2 % (ref 39.0–52.0)
Hemoglobin: 16.1 g/dL (ref 13.0–17.0)
MCH: 29.1 pg (ref 26.0–34.0)
MCHC: 34.8 g/dL (ref 30.0–36.0)
MCV: 83.5 fL (ref 80.0–100.0)
Platelets: 268 10*3/uL (ref 150–400)
RBC: 5.53 MIL/uL (ref 4.22–5.81)
RDW: 13.8 % (ref 11.5–15.5)
WBC: 11.6 10*3/uL — ABNORMAL HIGH (ref 4.0–10.5)
nRBC: 0 % (ref 0.0–0.2)

## 2023-04-26 LAB — TROPONIN I (HIGH SENSITIVITY): Troponin I (High Sensitivity): 6 ng/L (ref <18)

## 2023-04-26 LAB — TSH: TSH: 1.33 u[IU]/mL (ref 0.350–4.500)

## 2023-04-26 LAB — MAGNESIUM: Magnesium: 2.3 mg/dL (ref 1.7–2.4)

## 2023-04-26 MED ORDER — DILTIAZEM HCL 25 MG/5ML IV SOLN
10.0000 mg | Freq: Once | INTRAVENOUS | Status: AC
  Administered 2023-04-26: 10 mg via INTRAVENOUS
  Filled 2023-04-26: qty 5

## 2023-04-26 MED ORDER — DILTIAZEM HCL 25 MG/5ML IV SOLN
20.0000 mg | Freq: Once | INTRAVENOUS | Status: AC
  Administered 2023-04-26: 20 mg via INTRAVENOUS
  Filled 2023-04-26: qty 5

## 2023-04-26 MED ORDER — DILTIAZEM HCL ER 120 MG PO TB24: 120.0000 mg | ORAL_TABLET | Freq: Every day | ORAL | 0 refills | Status: DC

## 2023-04-26 MED ORDER — DILTIAZEM HCL 30 MG PO TABS
30.0000 mg | ORAL_TABLET | Freq: Once | ORAL | Status: AC
  Administered 2023-04-26: 30 mg via ORAL
  Filled 2023-04-26: qty 1

## 2023-04-26 NOTE — ED Notes (Signed)
After cardizem, pt advised has tingling in L shoulder intermittently. No longer diaphoretic, no SOB. 3/10 discomfort but feels better than before. Palpitations seem less extreme.

## 2023-04-26 NOTE — ED Notes (Signed)
Discharge paperwork reviewed entirely with patient, including follow up care. Pain was under control. The patient received instruction and coaching on their prescriptions, and all follow-up questions were answered.  Pt verbalized understanding as well as all parties involved. No questions or concerns voiced at the time of discharge. No acute distress noted.   Pt ambulated out to PVA without incident or assistance.  

## 2023-04-26 NOTE — ED Triage Notes (Signed)
Pt w/ palpitations, SHOB x about 1 hr; HR noted to be 180

## 2023-04-26 NOTE — ED Provider Notes (Signed)
[place] [facility] DEPARTMENT AT [facility] Provider Note   CSN: [id number] Arrival date & time: 04/26/23  1522     History  Chief Complaint  Patient presents with   Palpitations    Martin Chavez is a 52 y.o. male.  Patient with no significant past medical history including cardiac history presents to the emergency department today for evaluation of tachypalpitations and shortness of breath.  Symptoms started about an hour ago.  He states that he has had episodes of this, not daily, but really for about 4 to 5 months.  Today he had some vague chest pressure in the left chest associated with symptoms.  He denies history of thyroid problems, cardiac disease.  Denies stimulant drugs, decongestants or other over-the-counter medications.  He is a smoker.       Home Medications Prior to Admission medications   Not on File      Allergies    Patient has no known allergies.    Review of Systems   Review of Systems  Physical Exam Updated Vital Signs BP 102/86 (BP Location: Left Arm)   Pulse (!) 172   Temp 97.7 F (36.5 C)   Resp 20   Ht 5\' 9"  (1.753 m)   Wt 71.2 kg   SpO2 100%   BMI 23.18 kg/m  Physical Exam Vitals and nursing note reviewed.  Constitutional:      Appearance: He is well-developed. He is not diaphoretic.  HENT:     Head: Normocephalic and atraumatic.     Mouth/Throat:     Mouth: Mucous membranes are not dry.  Eyes:     Conjunctiva/sclera: Conjunctivae normal.  Neck:     Vascular: Normal carotid pulses. No carotid bruit or JVD.     Trachea: Trachea normal. No tracheal deviation.  Cardiovascular:     Rate and Rhythm: Normal rate. Rhythm irregularly irregular.     Pulses: No decreased pulses.          Radial pulses are 2+ on the right side and 2+ on the left side.     Heart sounds: Normal heart sounds, S1 normal and S2 normal. Heart sounds not distant. No murmur heard. Pulmonary:     Effort: Pulmonary effort is normal. No  respiratory distress.     Breath sounds: Normal breath sounds. No wheezing.  Chest:     Chest wall: No tenderness.  Abdominal:     General: Bowel sounds are normal.     Palpations: Abdomen is soft.     Tenderness: There is no abdominal tenderness. There is no guarding or rebound.  Musculoskeletal:     Cervical back: Normal range of motion and neck supple. No muscular tenderness.     Right lower leg: No edema.     Left lower leg: No edema.  Skin:    General: Skin is warm and dry.     Coloration: Skin is not pale.  Neurological:     Mental Status: He is alert. Mental status is at baseline.  Psychiatric:        Mood and Affect: Mood normal.     ED Results / Procedures / Treatments   Labs (all labs ordered are listed, but only abnormal results are displayed) Labs Reviewed  BASIC METABOLIC PANEL - Abnormal; Notable for the following components:      Result Value   CO2 21 (*)    Glucose, Bld 113 (*)    All other components within normal limits  CBC -  Abnormal; Notable for the following components:   WBC 11.6 (*)    All other components within normal limits  MAGNESIUM  TSH  TROPONIN I (HIGH SENSITIVITY)    EKG EKG Interpretation Date/Time:  Wednesday April 26 2023 15:43:16 EDT Ventricular Rate:  141 PR Interval:  141 QRS Duration:  96 QT Interval:  302 QTC Calculation: 405 R Axis:   -78  Text Interpretation: Sinus tachycardia with episode of supraventricular tachycardia, nonsustained LAE, consider biatrial enlargement Left anterior fascicular block RSR' in V1 or V2, right VCD or RVH Confirmed by Alvino Blood ([id number]) on 04/26/2023 3:59:01 PM  Radiology No results found.  Procedures Procedures    Medications Ordered in ED Medications  diltiazem (CARDIZEM) injection 20 mg (has no administration in time range)    ED Course/ Medical Decision Making/ A&P    Patient seen and examined. History obtained directly from patient.  Patient discussed with Dr. Suezanne Jacquet.  Patient frequently going between a narrow complex tachycardia, possibly A-fib versus sinus tachycardia at around 100.  Labs/EKG: CBC, BMP, magnesium, TSH, troponin  Imaging: Ordered chest x-ray.  Medications/Fluids: Ordered: Cardizem.   Most recent vital signs reviewed and are as follows: BP 102/86 (BP Location: Left Arm)   Pulse (!) 172   Temp 97.7 F (36.5 C)   Resp 20   Ht 5\' 9"  (1.753 m)   Wt 71.2 kg   SpO2 100%   BMI 23.18 kg/m   Initial impression: Tachycardia  4:48 PM Reassessment performed. Patient appears stable.   HR 80's when in sinus, 120-140 mostly otherwise with what appears to be afib.   ED ECG REPORT   Date: 04/26/2023  Rate: 105  Rhythm: sinus tachycardia and atrial fibrillation  QRS Axis: left  Intervals: normal  ST/T Wave abnormalities: normal  Conduction Disutrbances:none  Narrative Interpretation:   Old EKG Reviewed: changes noted  I have personally reviewed the EKG tracing and agree with the computerized printout as noted.  Most current vital signs reviewed and are as follows: BP (!) 111/91   Pulse (!) 48   Temp 97.7 F (36.5 C)   Resp 16   Ht 5\' 9"  (1.753 m)   Wt 71.2 kg   SpO2 95%   BMI 23.18 kg/m   Plan: Will continue rate control, additional 10mg  cardizem, oral dose cardizem.   6:08 PM Reassessment performed. Patient appears stable, asymptomatic.  He has converted to sinus rhythm, heart rate in the 70s.  Labs personally reviewed and interpreted including: CBC with minimally elevated blood cell count, normal hemoglobin; BMP unremarkable with normal electrolytes; magnesium normal; troponin normal at 6.  TSH pending.  Imaging personally visualized and interpreted including: Chest x-ray, agree negative.  Reviewed pertinent lab work and imaging with patient at bedside. Questions answered.   Most current vital signs reviewed and are as follows: BP (!) 109/97   Pulse (!) 48   Temp 97.7 F (36.5 C)   Resp 12   Ht 5\' 9"  (1.753 m)    Wt 71.2 kg   SpO2 96%   BMI 23.18 kg/m   Plan: Discharge to home.   Prescriptions written for: Diltiazem LA 120 mg daily  Other home care instructions discussed: Avoidance of strenuous exercise  ED return instructions discussed: Return with chest pain, shortness of breath, lightheadedness or syncope, persistent palpitations, new or other symptoms.  Follow-up instructions discussed: Discussed need for follow-up with atrial fibrillation clinic.  Referral has been placed and patient is been given contact information.  CHA2DS2-VASc 2  score equals 0.  Will not initiate anticoagulation.  Discussed with patient and he will ask cardiology about taking aspirin daily.  CRITICAL CARE Performed by: Renne Crigler PA-C Total critical care time: 45 minutes Critical care time was exclusive of separately billable procedures and treating other patients. Critical care was necessary to treat or prevent imminent or life-threatening deterioration. Critical care was time spent personally by me on the following activities: development of treatment plan with patient and/or surrogate as well as nursing, discussions with consultants, evaluation of patient's response to treatment, examination of patient, obtaining history from patient or surrogate, ordering and performing treatments and interventions, ordering and review of laboratory studies, ordering and review of radiographic studies, pulse oximetry and re-evaluation of patient's condition.                                  Medical Decision Making Amount and/or Complexity of Data Reviewed Labs: ordered. Radiology: ordered.  Risk Prescription drug management.   Patient presents with shortness of breath and tachypalpitations, found to be in paroxysmal atrial fibrillation with rapid ventricular response.  Patient was treated with IV Cardizem with reasonable rate control and then spontaneously converted while in the emergency room.  Patient is back to  baseline.  Feel that at this time he can be discharged with outpatient follow-up.  He seems reliable to return with worsening.  Lab work is reassuring, however still awaiting TSH which could be followed up at atrial fibrillation clinic.  The patient's vital signs, pertinent lab work and imaging were reviewed and interpreted as discussed in the ED course. Hospitalization was considered for further testing, treatments, or serial exams/observation. However as patient is well-appearing, has a stable exam, and reassuring studies today, I do not feel that they warrant admission at this time. This plan was discussed with the patient who verbalizes agreement and comfort with this plan and seems reliable and able to return to the Emergency Department with worsening or changing symptoms.          Final Clinical Impression(s) / ED Diagnoses Final diagnoses:  Atrial fibrillation with RVR [facility])    Rx / DC Orders ED Discharge Orders          Ordered    Amb referral to AFIB Clinic        04/26/23 1605    diltiazem (CARDIZEM LA) 120 MG 24 hr tablet  Daily        04/26/23 1805              Renne Crigler, PA-C 04/26/23 1812    Lonell Grandchild, MD 04/28/23 [phone]

## 2023-04-26 NOTE — Discharge Instructions (Signed)
Please read and follow all provided instructions.  Your diagnoses today include:  1. Atrial fibrillation with RVR (HCC)     Tests performed today include: An EKG of your heart: Showed fast heart rate consistent with atrial fibrillation, now controlled and back into normal rhythm A chest x-ray: No problems or signs of fluid overload Cardiac enzymes - a blood test for heart muscle damage, was normal Blood counts and electrolytes: No electrolyte abnormalities Vital signs. See below for your results today.   Medications prescribed:  Diltiazem: Calcium channel blocker to help control heart rate  Take any prescribed medications only as directed.  Follow-up instructions: You should hear back from the atrial fibrillation clinic in the next day or so.  If you do not hear from them by Friday morning, call the number provided discuss when you should be seen.  Return instructions:  SEEK IMMEDIATE MEDICAL ATTENTION IF: You have severe chest pain, especially if the pain is crushing or pressure-like and spreads to the arms, back, neck, or jaw, or if you have sweating, nausea or vomiting, or trouble with breathing. THIS IS AN EMERGENCY. Do not wait to see if the pain will go away. Get medical help at once. Call 911. DO NOT drive yourself to the hospital.  Your chest pain gets worse and does not go away after a few minutes of rest.  You have an attack of chest pain lasting longer than what you usually experience.  You have significant dizziness, if you pass out, or have trouble walking.  You have chest pain not typical of your usual pain for which you originally saw your caregiver.  You have any other emergent concerns regarding your health.  Your vital signs today were: BP (!) 109/97   Pulse (!) 48   Temp 97.7 F (36.5 C)   Resp 12   Ht 5\' 9"  (1.753 m)   Wt 71.2 kg   SpO2 96%   BMI 23.18 kg/m  If your blood pressure (BP) was elevated above 135/85 this visit, please have this repeated by your  doctor within one month. --------------

## 2023-05-10 ENCOUNTER — Other Ambulatory Visit (HOSPITAL_COMMUNITY): Payer: Self-pay | Admitting: Internal Medicine

## 2023-05-10 ENCOUNTER — Ambulatory Visit (HOSPITAL_COMMUNITY)
Admission: RE | Admit: 2023-05-10 | Discharge: 2023-05-10 | Disposition: A | Discharge status: Home / Self Care | Payer: BC Managed Care – PPO | Source: Ambulatory Visit | Attending: Internal Medicine | Admitting: Internal Medicine

## 2023-05-10 ENCOUNTER — Encounter (HOSPITAL_COMMUNITY): Payer: Self-pay | Admitting: Internal Medicine

## 2023-05-10 ENCOUNTER — Inpatient Hospital Stay (HOSPITAL_COMMUNITY): Admission: RE | Admit: 2023-05-10 | Payer: BC Managed Care – PPO | Source: Ambulatory Visit

## 2023-05-10 VITALS — BP 118/70 | HR 76 | Ht 69.0 in | Wt 159.2 lb

## 2023-05-10 DIAGNOSIS — R002 Palpitations: Secondary | ICD-10-CM | POA: Insufficient documentation

## 2023-05-10 DIAGNOSIS — I4891 Unspecified atrial fibrillation: Secondary | ICD-10-CM | POA: Diagnosis not present

## 2023-05-10 DIAGNOSIS — Z79899 Other long term (current) drug therapy: Secondary | ICD-10-CM | POA: Insufficient documentation

## 2023-05-10 NOTE — Progress Notes (Signed)
    Primary Care Physician: Esperanza Richters, PA-C Primary Cardiologist: None Electrophysiologist: None     Referring Physician: ED     Martin Chavez is a 52 y.o. male with no significant past medical history and possibly atrial fibrillation who presents for consultation in the [facility].  Seen in ED on 7/31 for palpitations and SOB x several months found to be in possible Afib. He spontaneously converted to NSR after IV cardizem. He is not on anticoagulation. Patient has a CHADS2VASC score of zero.  On evaluation today, he is currently in NSR. He describes similar episode happening at the beginning of the year where he felt intermittent episodes in a 2 week time period. Most recent episode happened after he was doing yard work for a period of time. He felt palpitations, tired, and SOB. He is currently on diltiazem 120 mg daily and has been taking it every other day. He drinks 2 cups of coffee daily and has occasional alcohol.   Today, he denies symptoms of chest pain, orthopnea, PND, lower extremity edema, dizziness, presyncope, syncope, snoring, daytime somnolence, bleeding, or neurologic sequela. The patient is tolerating medications without difficulties and is otherwise without complaint today.    Atrial Fibrillation Risk Factors:  he does not have symptoms or diagnosis of sleep apnea.   he has a BMI of Body mass index is 23.51 kg/m.Marland Kitchen Filed Weights   05/10/23 1109  Weight: 72.2 kg    Current Outpatient Medications  Medication Sig Dispense Refill   diltiazem (CARDIZEM LA) 120 MG 24 hr tablet Take 1 tablet (120 mg total) by mouth daily. 30 tablet 0   No current facility-administered medications for this encounter.    Atrial Fibrillation Management history:  Previous antiarrhythmic drugs: None Previous cardioversions: None Previous ablations: None Anticoagulation history: None   ROS- All systems are reviewed and negative except as per  the HPI above.  Physical Exam: BP 118/70   Pulse 76   Ht 5\' 9"  (1.753 m)   Wt 72.2 kg   BMI 23.51 kg/m   GEN: Well nourished, well developed in no acute distress NECK: No JVD; No carotid bruits CARDIAC: Regular rate and rhythm, no murmurs, rubs, gallops RESPIRATORY:  Clear to auscultation without rales, wheezing or rhonchi  ABDOMEN: Soft, non-tender, non-distended EXTREMITIES:  No edema; No deformity   EKG today demonstrates  Vent. rate 76 BPM PR interval 152 ms QRS duration 92 ms QT/QTcB 384/432 ms P-R-T axes -19 86 12 Normal sinus rhythm with sinus arrhythmia Incomplete right bundle branch block Borderline ECG When compared with ECG of 26-Apr-2023 16:38, PREVIOUS ECG IS PRESENT  Echo N/A  ASSESSMENT & PLAN CHA2DS2-VASc Score = 0  The patient's score is based upon: CHF History: 0 HTN History: 0 Diabetes History: 0 Stroke History: 0 Vascular Disease History: 0 Age Score: 0 Gender Score: 0       ASSESSMENT AND PLAN: Heart palpitations / possible Afib vs atrial flutter  He is currently in NSR.  ECG performed in ED appears to show possible Afib vs atrial flutter and sinus rhythm. Will place cardiac monitor for 2 weeks. He does not meet criteria for anticoagulation.    Follow up 6 weeks to discuss monitor results.    Lake Bells, PA-C  Afib Clinic [facility] [address] [place], [place] [address] [phone]

## 2023-06-21 ENCOUNTER — Ambulatory Visit (HOSPITAL_COMMUNITY)
Admission: RE | Admit: 2023-06-21 | Discharge: 2023-06-21 | Disposition: A | Discharge status: Home / Self Care | Payer: BC Managed Care – PPO | Source: Ambulatory Visit | Attending: Internal Medicine | Admitting: Internal Medicine

## 2023-06-21 VITALS — BP 130/100 | HR 85 | Ht 69.0 in | Wt 159.0 lb

## 2023-06-21 DIAGNOSIS — I480 Paroxysmal atrial fibrillation: Secondary | ICD-10-CM

## 2023-06-21 DIAGNOSIS — Z79899 Other long term (current) drug therapy: Secondary | ICD-10-CM | POA: Insufficient documentation

## 2023-06-21 DIAGNOSIS — I4891 Unspecified atrial fibrillation: Secondary | ICD-10-CM | POA: Diagnosis not present

## 2023-06-21 MED ORDER — DILTIAZEM HCL ER 120 MG PO TB24: 120.0000 mg | ORAL_TABLET | Freq: Every day | ORAL | 6 refills | Status: AC

## 2023-06-21 NOTE — Progress Notes (Signed)
Primary Care Physician: Esperanza Richters, PA-C Primary Cardiologist: None Electrophysiologist: None     Referring Physician: ED     Martin Chavez is a 52 y.o. male with no significant past medical history and possibly atrial fibrillation who presents for consultation in the [facility].  Seen in ED on 7/31 for palpitations and SOB x several months found to be in possible Afib. He spontaneously converted to NSR after IV cardizem. He is not on anticoagulation. Patient has a CHADS2VASC score of zero.  On evaluation today, he is currently in NSR. He describes similar episode happening at the beginning of the year where he felt intermittent episodes in a 2 week time period. Most recent episode happened after he was doing yard work for a period of time. He felt palpitations, tired, and SOB. He is currently on diltiazem 120 mg daily and has been taking it every other day. He drinks 2 cups of coffee daily and has occasional alcohol.   On follow up 06/21/23 he is currently in NSR. He wore a cardiac monitor which showed <1% Afib burden with longest episode under 3 minutes. He stopped taking diltiazem 2 days after wearing the monitor. He notes at home his diastolic BP runs high, usually in the 90s. He did not have any issues while taking diltiazem. He has an upcoming trip where he will gone for a couple of months.   Today, he denies symptoms of chest pain, orthopnea, PND, lower extremity edema, dizziness, presyncope, syncope, snoring, daytime somnolence, bleeding, or neurologic sequela. The patient is tolerating medications without difficulties and is otherwise without complaint today.    Atrial Fibrillation Risk Factors:  he does not have symptoms or diagnosis of sleep apnea.   he has a BMI of Body mass index is 23.48 kg/m.Marland Kitchen Filed Weights   06/21/23 1115  Weight: 72.1 kg     Current Outpatient Medications  Medication Sig Dispense Refill   diltiazem  (CARDIZEM LA) 120 MG 24 hr tablet Take 1 tablet (120 mg total) by mouth daily. 30 tablet 6   No current facility-administered medications for this encounter.    Atrial Fibrillation Management history:  Previous antiarrhythmic drugs: None Previous cardioversions: None Previous ablations: None Anticoagulation history: None   ROS- All systems are reviewed and negative except as per the HPI above.  Physical Exam: BP (!) 130/100   Pulse 85   Ht 5\' 9"  (1.753 m)   Wt 72.1 kg   BMI 23.48 kg/m   GEN- The patient is well appearing, alert and oriented x 3 today.   Neck - no JVD or carotid bruit noted Lungs- Clear to ausculation bilaterally, normal work of breathing Heart- Regular rate and rhythm, no murmurs, rubs or gallops, PMI not laterally displaced Extremities- no clubbing, cyanosis, or edema Skin - no rash or ecchymosis noted   EKG today demonstrates  Vent. rate 85 BPM PR interval 154 ms QRS duration 90 ms QT/QTcB 364/433 ms P-R-T axes 83 -44 60 Normal sinus rhythm Left axis deviation Abnormal ECG When compared with ECG of 10-May-2023 11:18, PREVIOUS ECG IS PRESENT  Echo N/A  Cardiac monitor 8-05/2023: Patch Wear Time:  13 days and 23 hours   Patient had a min HR of 50 bpm, max HR of 222 bpm, and avg HR of 79 bpm.  Predominant underlying rhythm was Sinus Rhythm.  5 Supraventricular Tachycardia runs occurred, max rate of 222 bpm,  longest 15 beats Atrial Fibrillation occurred (<1% burden), ranging from  140-211 bpm (avg of 176 bpm), longest 2 mins 45 secs with an avg rate of 176 bpm.  <1% ventricular and supraventricular ectopy No patient triggered episodes recorded   Will Camnitz, MD  ASSESSMENT & PLAN CHA2DS2-VASc Score = 0  The patient's score is based upon: CHF History: 0 HTN History: 0 Diabetes History: 0 Stroke History: 0 Vascular Disease History: 0 Age Score: 0 Gender Score: 0       ASSESSMENT AND PLAN: Paroxysmal atrial fibrillation  He is  currently in NSR.  Cardiac monitor showed <1% Afib burden. He has very low burden. At this time, he does not meet criteria for anticoagulation due to low risk score. We will continue with conservative observation for now. Rhythm monitoring device recommended. Continue diltiazem 120 mg daily. Will arrange for echocardiogram - patient will schedule this when he returns from trip.     Follow up 6 months Afib clinic.    Lake Bells, PA-C  Afib Clinic [facility] [address] [place], [place] [address] [phone]

## 2023-08-30 ENCOUNTER — Ambulatory Visit (HOSPITAL_BASED_OUTPATIENT_CLINIC_OR_DEPARTMENT_OTHER)
Admission: RE | Admit: 2023-08-30 | Discharge: 2023-08-30 | Disposition: A | Discharge status: Home / Self Care | Payer: BC Managed Care – PPO | Source: Ambulatory Visit | Attending: Internal Medicine | Admitting: Internal Medicine

## 2023-08-30 DIAGNOSIS — I480 Paroxysmal atrial fibrillation: Secondary | ICD-10-CM | POA: Insufficient documentation

## 2023-08-30 LAB — ECHOCARDIOGRAM COMPLETE
AR max vel: 1.72 cm2
AV Area VTI: 1.88 cm2
AV Area mean vel: 1.73 cm2
AV Mean grad: 5 mm[Hg]
AV Peak grad: 9.4 mm[Hg]
Ao pk vel: 1.53 m/s
Area-P 1/2: 4.06 cm2
Calc EF: 63.8 %
S' Lateral: 2.8 cm
Single Plane A2C EF: 68.5 %
Single Plane A4C EF: 60.8 %

## 2023-12-20 ENCOUNTER — Ambulatory Visit (HOSPITAL_COMMUNITY): Payer: BC Managed Care – PPO | Admitting: Internal Medicine

## 2024-04-10 ENCOUNTER — Encounter: Payer: Self-pay | Admitting: Medical

## 2024-09-06 DIAGNOSIS — L814 Other melanin hyperpigmentation: Secondary | ICD-10-CM | POA: Diagnosis not present

## 2024-09-06 DIAGNOSIS — D2261 Melanocytic nevi of right upper limb, including shoulder: Secondary | ICD-10-CM | POA: Diagnosis not present

## 2024-09-06 DIAGNOSIS — D492 Neoplasm of unspecified behavior of bone, soft tissue, and skin: Secondary | ICD-10-CM | POA: Diagnosis not present

## 2024-09-06 DIAGNOSIS — D224 Melanocytic nevi of scalp and neck: Secondary | ICD-10-CM | POA: Diagnosis not present

## 2024-09-06 DIAGNOSIS — D229 Melanocytic nevi, unspecified: Secondary | ICD-10-CM | POA: Diagnosis not present

## 2024-09-06 DIAGNOSIS — D225 Melanocytic nevi of trunk: Secondary | ICD-10-CM | POA: Diagnosis not present
